# Patient Record
Sex: Male | Born: 2000 | Race: Black or African American | Hispanic: No | Marital: Single | Smoking: Current every day smoker
Health system: Southern US, Community
[De-identification: ages and names within clinical notes are randomized; demographics above are authoritative.]

## PROBLEM LIST (undated history)

## (undated) DIAGNOSIS — J45909 Unspecified asthma, uncomplicated: Secondary | ICD-10-CM

---
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
## 2023-11-20 ENCOUNTER — Emergency Department (HOSPITAL_COMMUNITY)
Admission: EM | Admit: 2023-11-20 | Discharge: 2023-11-20 | Disposition: A | Discharge status: Home / Self Care | Payer: Medicaid Other | Attending: Emergency Medicine | Admitting: Emergency Medicine

## 2023-11-20 DIAGNOSIS — K029 Dental caries, unspecified: Secondary | ICD-10-CM | POA: Diagnosis not present

## 2023-11-20 DIAGNOSIS — K0889 Other specified disorders of teeth and supporting structures: Secondary | ICD-10-CM | POA: Diagnosis present

## 2023-11-20 MED ORDER — PENICILLIN V POTASSIUM 500 MG PO TABS
500.0000 mg | ORAL_TABLET | Freq: Once | ORAL | Status: AC
  Administered 2023-11-20: 500 mg via ORAL
  Filled 2023-11-20: qty 1

## 2023-11-20 MED ORDER — LIDOCAINE VISCOUS HCL 2 % MT SOLN
15.0000 mL | Freq: Once | OROMUCOSAL | Status: AC
  Administered 2023-11-20: 15 mL via OROMUCOSAL
  Filled 2023-11-20: qty 15

## 2023-11-20 MED ORDER — PENICILLIN V POTASSIUM 500 MG PO TABS: 500.0000 mg | ORAL_TABLET | Freq: Four times a day (QID) | ORAL | 0 refills | Status: AC

## 2023-11-20 MED ORDER — MAGIC MOUTHWASH: 5.0000 mL | Freq: Three times a day (TID) | ORAL | 0 refills | Status: AC | PRN

## 2023-11-20 NOTE — ED Provider Notes (Signed)
[place] EMERGENCY DEPARTMENT AT [facility] Provider Note   CSN: [id number] Arrival date & time: 11/20/23  4098     History  Chief Complaint  Patient presents with   Dental Pain    Jeffrey Mckay is a 22 y.o. male.  The history is provided by the patient.  Dental Pain Location:  Lower Lower teeth location:  31/RL 2nd molar and 30/RL 1st molar Quality:  Aching Severity:  Severe Onset quality:  Gradual Duration: months. Timing:  Constant Progression:  Unchanged Chronicity:  Chronic Previous work-up:  Dental exam Relieved by:  Nothing Worsened by:  Nothing Ineffective treatments:  None tried Associated symptoms: no facial pain, no facial swelling and no fever   Risk factors: no diabetes        Home Medications Prior to Admission medications   Medication Sig Start Date End Date Taking? Authorizing Provider  magic mouthwash SOLN Take 5 mLs by mouth 3 (three) times daily as needed for mouth pain. Suspension contains equal amounts of Maalox Extra Strength, nystatin, and diphenhydramine. 11/20/23  Yes Sofija Antwi, MD  penicillin v potassium (VEETID) 500 MG tablet Take 1 tablet (500 mg total) by mouth 4 (four) times daily for 7 days. 11/20/23 11/27/23 Yes Gerber Penza, MD  albuterol (PROVENTIL HFA;VENTOLIN HFA) 108 (90 Base) MCG/ACT inhaler Inhale 1-2 puffs into the lungs every 6 (six) hours as needed for wheezing or shortness of breath.    [provider]  diphenhydrAMINE HCl (BENADRYL PO) Take 1 tablet by mouth daily as needed.    [provider]  ibuprofen (ADVIL,MOTRIN) 400 MG tablet Take 1 tablet (400 mg total) by mouth every 6 (six) hours as needed. Patient not taking: Reported on 06/29/2020 03/21/16   Ivery Quale, PA-C  methocarbamol (ROBAXIN) 500 MG tablet Take 1 tablet (500 mg total) by mouth 2 (two) times daily. 06/29/20   Mare Ferrari, PA-C  oxymetazoline (AFRIN NASAL SPRAY) 0.05 % nasal spray Place 1 spray into both nostrils 2  (two) times daily. Use for five days only. Patient not taking: Reported on 06/29/2020 03/21/16   Ivery Quale, PA-C  promethazine-dextromethorphan (PROMETHAZINE-DM) 6.25-15 MG/5ML syrup Take 5 mLs by mouth every 6 (six) hours as needed for cough. Patient not taking: Reported on 06/29/2020 03/21/16   Ivery Quale, PA-C      Allergies    Patient has no known allergies.    Review of Systems   Review of Systems  Constitutional:  Negative for fever.  HENT:  Positive for dental problem. Negative for facial swelling.   Eyes:  Negative for redness.  Respiratory:  Negative for wheezing and stridor.   All other systems reviewed and are negative.   Physical Exam Updated Vital Signs BP (!) 143/104 (BP Location: Left Arm)   Pulse (!) 59   Resp 19   SpO2 99%  Physical Exam Vitals and nursing note reviewed.  Constitutional:      General: He is not in acute distress.    Appearance: Normal appearance. He is well-developed. He is not diaphoretic.  HENT:     Head: Normocephalic and atraumatic.     Nose: Nose normal.     Mouth/Throat:     Comments: Wide spread dental decay 1/3 of right lower first molar is missing  Eyes:     Conjunctiva/sclera: Conjunctivae normal.     Pupils: Pupils are equal, round, and reactive to light.  Cardiovascular:     Rate and Rhythm: Normal rate and regular rhythm.  Pulmonary:  Effort: Pulmonary effort is normal.     Breath sounds: Normal breath sounds. No wheezing or rales.  Abdominal:     General: Bowel sounds are normal.     Palpations: Abdomen is soft.     Tenderness: There is no abdominal tenderness. There is no guarding or rebound.  Musculoskeletal:        General: Normal range of motion.     Cervical back: Normal range of motion and neck supple.  Skin:    General: Skin is warm and dry.     Capillary Refill: Capillary refill takes less than 2 seconds.  Neurological:     General: No focal deficit present.     Mental Status: He is alert and oriented  to person, place, and time.     Deep Tendon Reflexes: Reflexes normal.  Psychiatric:        Mood and Affect: Mood normal.        Behavior: Behavior normal.     ED Results / Procedures / Treatments   Labs (all labs ordered are listed, but only abnormal results are displayed) Labs Reviewed - No data to display  EKG None  Radiology No results found.  Procedures Procedures    Medications Ordered in ED Medications  penicillin v potassium (VEETID) tablet 500 mg (500 mg Oral Given 11/20/23 0317)  lidocaine (XYLOCAINE) 2 % viscous mouth solution 15 mL (15 mLs Mouth/Throat Given 11/20/23 1610)    ED Course/ Medical Decision Making/ A&P                                 Medical Decision Making Dental caries no recent dental care   Amount and/or Complexity of Data Reviewed External Data Reviewed: notes.    Details: Previous notes reviewed   Risk Prescription drug management. Risk Details: Wide spread dental decay will start antibiotics and refer to dentistry. Stable for discharge.     Final Clinical Impression(s) / ED Diagnoses Final diagnoses:  Pain due to dental caries   Return for intractable cough, coughing up blood, fevers > 100.4 unrelieved by medication, shortness of breath, intractable vomiting, chest pain, shortness of breath, weakness, numbness, changes in speech, facial asymmetry, abdominal pain, passing out, Inability to tolerate liquids or food, cough, altered mental status or any concerns. No signs of systemic illness or infection. The patient is nontoxic-appearing on exam and vital signs are within normal limits.  I have reviewed the triage vital signs and the nursing notes. Pertinent labs & imaging results that were available during my care of the patient were reviewed by me and considered in my medical decision making (see chart for details). After history, exam, and medical workup I feel the patient has been appropriately medically screened and is safe for  discharge home. Pertinent diagnoses were discussed with the patient. Patient was given return precautions.  Rx / DC Orders ED Discharge Orders          Ordered    penicillin v potassium (VEETID) 500 MG tablet  4 times daily        11/20/23 0329    magic mouthwash SOLN  3 times daily PRN        11/20/23 0329              Sheliah Fiorillo, MD 11/20/23 9604

## 2023-11-20 NOTE — ED Triage Notes (Signed)
PT having right sided tooth pain intermittently for months but worsening past few days. Irrigates mouth with saline and peroxide. R ear started to hurt yesterday. Pt denies fevers. States pain is unbearable. No swelling noted.

## 2023-12-12 ENCOUNTER — Emergency Department (HOSPITAL_COMMUNITY): Payer: Medicaid Other

## 2023-12-12 ENCOUNTER — Other Ambulatory Visit: Payer: Self-pay

## 2023-12-12 ENCOUNTER — Emergency Department (HOSPITAL_COMMUNITY)
Admission: EM | Admit: 2023-12-12 | Discharge: 2023-12-12 | Disposition: A | Discharge status: Home / Self Care | Payer: Medicaid Other | Attending: Emergency Medicine | Admitting: Emergency Medicine

## 2023-12-12 DIAGNOSIS — K047 Periapical abscess without sinus: Secondary | ICD-10-CM | POA: Insufficient documentation

## 2023-12-12 DIAGNOSIS — J45909 Unspecified asthma, uncomplicated: Secondary | ICD-10-CM | POA: Insufficient documentation

## 2023-12-12 DIAGNOSIS — Z7951 Long term (current) use of inhaled steroids: Secondary | ICD-10-CM | POA: Diagnosis not present

## 2023-12-12 DIAGNOSIS — K0889 Other specified disorders of teeth and supporting structures: Secondary | ICD-10-CM | POA: Diagnosis present

## 2023-12-12 LAB — I-STAT CHEM 8, ED
BUN: 9 mg/dL (ref 6–20)
Calcium, Ion: 1.18 mmol/L (ref 1.15–1.40)
Chloride: 102 mmol/L (ref 98–111)
Creatinine, Ser: 0.9 mg/dL (ref 0.61–1.24)
Glucose, Bld: 92 mg/dL (ref 70–99)
HCT: 54 % — ABNORMAL HIGH (ref 39.0–52.0)
Hemoglobin: 18.4 g/dL — ABNORMAL HIGH (ref 13.0–17.0)
Potassium: 4.5 mmol/L (ref 3.5–5.1)
Sodium: 139 mmol/L (ref 135–145)
TCO2: 27 mmol/L (ref 22–32)

## 2023-12-12 MED ORDER — SODIUM CHLORIDE 0.9 % IV SOLN
3.0000 g | Freq: Once | INTRAVENOUS | Status: AC
  Administered 2023-12-12: 3 g via INTRAVENOUS
  Filled 2023-12-12: qty 8

## 2023-12-12 MED ORDER — ONDANSETRON HCL 4 MG/2ML IJ SOLN
4.0000 mg | Freq: Once | INTRAMUSCULAR | Status: AC
  Administered 2023-12-12: 4 mg via INTRAVENOUS
  Filled 2023-12-12: qty 2

## 2023-12-12 MED ORDER — AMOXICILLIN-POT CLAVULANATE 875-125 MG PO TABS: 1.0000 | ORAL_TABLET | Freq: Two times a day (BID) | ORAL | 0 refills | Status: AC

## 2023-12-12 MED ORDER — METHYLPREDNISOLONE SODIUM SUCC 125 MG IJ SOLR
125.0000 mg | Freq: Once | INTRAMUSCULAR | Status: AC
  Administered 2023-12-12: 125 mg via INTRAVENOUS
  Filled 2023-12-12: qty 2

## 2023-12-12 MED ORDER — MORPHINE SULFATE (PF) 4 MG/ML IV SOLN
4.0000 mg | Freq: Once | INTRAVENOUS | Status: AC
  Administered 2023-12-12: 4 mg via INTRAVENOUS
  Filled 2023-12-12: qty 1

## 2023-12-12 MED ORDER — IOHEXOL 300 MG/ML  SOLN
75.0000 mL | Freq: Once | INTRAMUSCULAR | Status: AC | PRN
  Administered 2023-12-12: 75 mL via INTRAVENOUS

## 2023-12-12 MED ORDER — IOHEXOL 300 MG/ML  SOLN: 100.0000 mL | Freq: Once | INTRAMUSCULAR | Status: DC | PRN

## 2023-12-12 NOTE — ED Triage Notes (Signed)
Pt arrived via POV. C/o R sided oral swelling and pain for several days.

## 2023-12-12 NOTE — ED Notes (Signed)
Patient refused IV ABX provider aware

## 2023-12-12 NOTE — ED Provider Notes (Signed)
[place] EMERGENCY DEPARTMENT AT [facility] Provider Note   CSN: [id number] Arrival date & time: 12/12/23  1358     History  Chief Complaint  Patient presents with   Oral Swelling   Dental Pain    Jeffrey Mckay is a 22 y.o. male with PMHx asthma who presents to ED concerned with right lower dental pain and swelling. Patient stating that it started 2 days ago. Endorses broken tooth in the right lower side x3 months. Denies throat swelling or difficulty tolerating PO intake.  Denies fever, chest pain, dyspnea, cough, nausea, vomiting, diarrhea.   Dental Pain      Home Medications Prior to Admission medications   Medication Sig Start Date End Date Taking? Authorizing Provider  acetaminophen (TYLENOL) 500 MG tablet Take 2,500 mg by mouth as needed for moderate pain (pain score 4-6).   Yes [provider]  albuterol (PROVENTIL HFA;VENTOLIN HFA) 108 (90 Base) MCG/ACT inhaler Inhale 1-2 puffs into the lungs every 6 (six) hours as needed for wheezing or shortness of breath.   Yes [provider]  amoxicillin-clavulanate (AUGMENTIN) 875-125 MG tablet Take 1 tablet by mouth every 12 (twelve) hours for 10 days. 12/12/23 12/22/23 Yes Janay Canan, Charlotte Sanes F, PA-C  magic mouthwash SOLN Take 5 mLs by mouth 3 (three) times daily as needed for mouth pain. Suspension contains equal amounts of Maalox Extra Strength, nystatin, and diphenhydramine. Patient not taking: Reported on 12/12/2023 11/20/23   Palumbo, April, MD  nystatin (MYCOSTATIN) 100000 UNIT/ML suspension Take by mouth. Patient not taking: Reported on 12/12/2023 11/22/23   [provider]      Allergies    Patient has no known allergies.    Review of Systems   Review of Systems  HENT:  Positive for dental problem.     Physical Exam Updated Vital Signs BP (!) 144/101   Pulse 74   Temp 99.1 F (37.3 C) (Oral)   Resp 18   Ht 6' (1.829 m)   Wt 68 kg   SpO2 100%   BMI 20.34 kg/m   Physical Exam Vitals and nursing note reviewed.  Constitutional:      General: He is not in acute distress.    Appearance: He is not ill-appearing or toxic-appearing.  HENT:     Head: Normocephalic and atraumatic.     Mouth/Throat:     Mouth: Mucous membranes are moist.     Pharynx: No oropharyngeal exudate or posterior oropharyngeal erythema.      Comments: 3cm area of swelling along the exterior right mandibular surface. No obvious oropharyngeal swelling or lesions. Broken molar on right lower side. No trismus. Eyes:     General: No scleral icterus.       Right eye: No discharge.        Left eye: No discharge.     Conjunctiva/sclera: Conjunctivae normal.  Cardiovascular:     Rate and Rhythm: Normal rate.  Pulmonary:     Effort: Pulmonary effort is normal.  Abdominal:     General: Abdomen is flat.  Skin:    General: Skin is warm and dry.     Findings: No rash.  Neurological:     General: No focal deficit present.     Mental Status: He is alert. Mental status is at baseline.  Psychiatric:        Mood and Affect: Mood normal.        Behavior: Behavior normal.     ED Results / Procedures / Treatments  Labs (all labs ordered are listed, but only abnormal results are displayed) Labs Reviewed  I-STAT CHEM 8, ED - Abnormal; Notable for the following components:      Result Value   Hemoglobin 18.4 (*)    HCT 54.0 (*)    All other components within normal limits    EKG None  Radiology CT Soft Tissue Neck W Contrast Result Date: 12/12/2023 CLINICAL DATA:  Provided history: Soft tissue infection suspected, neck x-ray done. Additional history provided: Right sided oral swelling and pain for several days. EXAM: CT NECK WITH CONTRAST TECHNIQUE: Multidetector CT imaging of the neck was performed using the standard protocol following the bolus administration of intravenous contrast. RADIATION DOSE REDUCTION: This exam was performed according to the departmental  dose-optimization program which includes automated exposure control, adjustment of the mA and/or kV according to patient size and/or use of iterative reconstruction technique. CONTRAST:  75mL OMNIPAQUE IOHEXOL 300 MG/ML  SOLN COMPARISON:  Head CT 03/18/2020 FINDINGS: Pharynx and larynx: No appreciable swelling or mass within the oral cavity, pharynx or larynx. Salivary glands: No inflammation, mass, or stone. Thyroid: 3 mm right thyroid lobe nodule not meeting consensus criteria for ultrasound follow-up based on size. No follow-up imaging recommended. Reference: J Am Coll Radiol. 2015 Feb;12(2): 143-50. Lymph nodes: Prominent right perimandibular lymph node measuring 10 mm in short-axis, likely reactive (series 3, image 64). Vascular: The major vascular structures of the neck are patent. Limited intracranial: No evidence of an acute intracranial abnormality within the field of view. Visualized orbits: No orbital mass or acute orbital finding. Mastoids and visualized paranasal sinuses: A small portion of the right frontal sinus is excluded from the field of view superiorly. No significant paranasal sinus disease at the imaged levels. No significant mastoid effusion. Skeleton: Nonspecific reversal of the expected cervical lordosis. Upper chest: No consolidation within the imaged lung apices. Other: Periapical lucency surrounding the right mandibular first molar tooth (consistent with apical periodontal disease and possible periapical abscess). This tooth is carious. There is an adjacent low-density and peripherally enhancing focus along the superficial aspect of the right mandibular body, measuring 12 x 5 mm in transaxial dimensions, suspicious for a soft tissue abscess (series 3, image 61). Surrounding soft tissue swelling and edema centered in the right perimandibular region consistent with cellulitis. IMPRESSION: 1. Periapical lucency surrounding the carious right mandibular first molar tooth (consistent with  apical periodontal disease and possible periapical abscess). Adjacent 12 x 5 mm hypodense and peripherally enhancing focus along the superficial aspect of the right mandibular body, suspicious for a soft tissue abscess. Surrounding facial/perimandibular cellulitis. 2. Prominent right perimandibular lymph node, likely reactive. Electronically Signed   By: Jackey Loge D.O.   On: 12/12/2023 19:10    Procedures Procedures    Medications Ordered in ED Medications  methylPREDNISolone sodium succinate (SOLU-MEDROL) 125 mg/2 mL injection 125 mg (has no administration in time range)  Ampicillin-Sulbactam (UNASYN) 3 g in sodium chloride 0.9 % 100 mL IVPB (0 g Intravenous Stopped 12/12/23 1716)  morphine (PF) 4 MG/ML injection 4 mg (4 mg Intravenous Given 12/12/23 1647)  ondansetron (ZOFRAN) injection 4 mg (4 mg Intravenous Given 12/12/23 1648)  iohexol (OMNIPAQUE) 300 MG/ML solution 75 mL (75 mLs Intravenous Contrast Given 12/12/23 1817)    ED Course/ Medical Decision Making/ A&P                                 Medical Decision  Making Amount and/or Complexity of Data Reviewed Radiology: ordered.  Risk Prescription drug management.    This patient presents to the ED for concern of dental pain/abscess, this involves an extensive number of treatment options, and is a complaint that carries with it a high risk of complications and morbidity.  The differential diagnosis includes deep space infection   Co morbidities that complicate the patient evaluation  none   Additional history obtained:  PCP with [facility]   Imaging Studies ordered:  I ordered imaging studies including  -CT soft tissue: to assess for abscess/deep space infection  I independently visualized and interpreted imaging I agree with the radiologist interpretation    Problem List / ED Course / Critical interventions / Medication management  Patient presents to ED concerned for dental abscess.   Physical exam showing multiple carries and broken right sided molar with swelling of right mandibular area. Patient denies recently seeing a Dentist. Patient afebrile with stable vitals. Denying any other infectious symptoms today. CTA showing a very small abscess (12 x 5 mm) with extensive surrounding swelling concerning for cellulitis. This area of swelling is not impinging on patient's airway. Provided patient with a dose of IV Unasyn in ED and with discharge with a 10 day course of Augmentin. Provided patient with list for dentist in the area. Educated patient that symptoms will return and may become more complicated if dentist follow up is not obtained. Patient verbally endorsed understanding of plan. Staffed with Dr. Wilkie Aye. I have reviewed the patients home medicines and have made adjustments as needed Patient afebrile with stable vials. Provided with return precautions. Discharged in good condition.  Ddx: these are considered less likely due to history of present illness and physical exam findings -Deep space infection of head/neck: Denies dyspnea, dysphagia, trismus -Sepsis: patient afebrile without tachycardia or hypotension; no leukocytosis   Social Determinants of Health:  none         Final Clinical Impression(s) / ED Diagnoses Final diagnoses:  Dental infection    Rx / DC Orders ED Discharge Orders          Ordered    amoxicillin-clavulanate (AUGMENTIN) 875-125 MG tablet  Every 12 hours        12/12/23 1955              Dorthy Cooler, [place] 12/12/23 2006    Rozelle Logan, [place] 12/13/23 [phone]

## 2023-12-12 NOTE — Discharge Instructions (Addendum)
It was a pleasure caring for you today. Please contact a dentist tomorrow for a quick follow up appointment. Also follow up with your primary care provider. Seek emergency care if experiencing any new or worsening symptoms.  I have had great luck with quick appointments with [facility] here in [place].  Alternating between 650 mg Tylenol and 400 mg Advil: The best way to alternate taking Acetaminophen (example Tylenol) and Ibuprofen (example Advil/Motrin) is to take them 3 hours apart. For example, if you take ibuprofen at 6 am you can then take Tylenol at 9 am. You can continue this regimen throughout the day, making sure you do not exceed the recommended maximum dose for each drug.

## 2024-02-18 ENCOUNTER — Emergency Department (HOSPITAL_COMMUNITY)
Admission: EM | Admit: 2024-02-18 | Discharge: 2024-02-18 | Disposition: A | Discharge status: Home / Self Care | Attending: Emergency Medicine | Admitting: Emergency Medicine

## 2024-02-18 ENCOUNTER — Emergency Department (HOSPITAL_COMMUNITY)

## 2024-02-18 ENCOUNTER — Other Ambulatory Visit: Payer: Self-pay

## 2024-02-18 DIAGNOSIS — R0789 Other chest pain: Secondary | ICD-10-CM | POA: Diagnosis not present

## 2024-02-18 DIAGNOSIS — R112 Nausea with vomiting, unspecified: Secondary | ICD-10-CM | POA: Diagnosis present

## 2024-02-18 DIAGNOSIS — R059 Cough, unspecified: Secondary | ICD-10-CM | POA: Insufficient documentation

## 2024-02-18 LAB — CBC
HCT: 42.3 % (ref 39.0–52.0)
Hemoglobin: 13.6 g/dL (ref 13.0–17.0)
MCH: 30.2 pg (ref 26.0–34.0)
MCHC: 32.2 g/dL (ref 30.0–36.0)
MCV: 94 fL (ref 80.0–100.0)
Platelets: 130 10*3/uL — ABNORMAL LOW (ref 150–400)
RBC: 4.5 MIL/uL (ref 4.22–5.81)
RDW: 14.7 % (ref 11.5–15.5)
WBC: 10.3 10*3/uL (ref 4.0–10.5)
nRBC: 0 % (ref 0.0–0.2)

## 2024-02-18 LAB — LIPASE, BLOOD: Lipase: 34 U/L (ref 11–51)

## 2024-02-18 LAB — RESP PANEL BY RT-PCR (RSV, FLU A&B, COVID)  RVPGX2
Influenza A by PCR: NEGATIVE
Influenza B by PCR: NEGATIVE
Resp Syncytial Virus by PCR: NEGATIVE
SARS Coronavirus 2 by RT PCR: NEGATIVE

## 2024-02-18 LAB — COMPREHENSIVE METABOLIC PANEL
ALT: 16 U/L (ref 0–44)
AST: 23 U/L (ref 15–41)
Albumin: 4.3 g/dL (ref 3.5–5.0)
Alkaline Phosphatase: 68 U/L (ref 38–126)
Anion gap: 6 (ref 5–15)
BUN: 18 mg/dL (ref 6–20)
CO2: 27 mmol/L (ref 22–32)
Calcium: 8.5 mg/dL — ABNORMAL LOW (ref 8.9–10.3)
Chloride: 105 mmol/L (ref 98–111)
Creatinine, Ser: 0.98 mg/dL (ref 0.61–1.24)
GFR, Estimated: >60 mL/min (ref >60)
Glucose, Bld: 89 mg/dL (ref 70–99)
Potassium: 3.5 mmol/L (ref 3.5–5.1)
Sodium: 138 mmol/L (ref 135–145)
Total Bilirubin: 0.3 mg/dL (ref 0.0–1.2)
Total Protein: 6.8 g/dL (ref 6.5–8.1)

## 2024-02-18 MED ORDER — ONDANSETRON 4 MG PO TBDP: 4.0000 mg | ORAL_TABLET | Freq: Three times a day (TID) | ORAL | 0 refills | Status: AC | PRN

## 2024-02-18 MED ORDER — ONDANSETRON HCL 4 MG/2ML IJ SOLN
4.0000 mg | Freq: Once | INTRAMUSCULAR | Status: AC
  Administered 2024-02-18: 4 mg via INTRAVENOUS
  Filled 2024-02-18: qty 2

## 2024-02-18 MED ORDER — LACTATED RINGERS IV BOLUS
1000.0000 mL | Freq: Once | INTRAVENOUS | Status: AC
  Administered 2024-02-18: 1000 mL via INTRAVENOUS

## 2024-02-18 NOTE — Discharge Instructions (Addendum)
 Your workup today was reassuring.  Have sent nausea medication into the pharmacy for you.  Drink plenty of fluids.  Return if you have any emergent symptoms.  Otherwise follow-up with your primary care provider.

## 2024-02-18 NOTE — ED Triage Notes (Signed)
 Pt arrived via POV. C/o N/V since last night and a cough for 4x days.  No known sick contacts.

## 2024-02-18 NOTE — ED Provider Notes (Signed)
 [place] EMERGENCY DEPARTMENT AT [facility] Provider Note   CSN: [id number] Arrival date & time: 02/18/24  7829     History  Chief Complaint  Patient presents with   Nausea   Emesis   Cough    Jeffrey Mckay is a 23 y.o. male.  23 year old male presents today for concern of URI symptoms for the past 4 days however he developed nausea and vomiting starting last night and he states he has had multiple episodes and has not been able to keep any food or drink down.  Denies hematemesis.  Denies abdominal pain.  The history is provided by the patient. No language interpreter was used.       Home Medications Prior to Admission medications   Medication Sig Start Date End Date Taking? Authorizing Provider  acetaminophen (TYLENOL) 500 MG tablet Take 2,500 mg by mouth as needed for moderate pain (pain score 4-6).    [provider]  albuterol (PROVENTIL HFA;VENTOLIN HFA) 108 (90 Base) MCG/ACT inhaler Inhale 1-2 puffs into the lungs every 6 (six) hours as needed for wheezing or shortness of breath.    [provider]  magic mouthwash SOLN Take 5 mLs by mouth 3 (three) times daily as needed for mouth pain. Suspension contains equal amounts of Maalox Extra Strength, nystatin, and diphenhydramine. Patient not taking: Reported on 12/12/2023 11/20/23   Palumbo, April, MD  nystatin (MYCOSTATIN) 100000 UNIT/ML suspension Take by mouth. Patient not taking: Reported on 12/12/2023 11/22/23   [provider]      Allergies    Patient has no known allergies.    Review of Systems   Review of Systems  Constitutional:  Negative for chills and fever.  Respiratory:  Positive for cough. Negative for shortness of breath.   Cardiovascular:  Negative for chest pain (Does endorse some chest tightness but no chest pain).  Gastrointestinal:  Positive for nausea and vomiting. Negative for abdominal pain.  Neurological:  Negative for light-headedness.  All other  systems reviewed and are negative.   Physical Exam Updated Vital Signs BP 115/78 (BP Location: Left Arm)   Pulse 82   Temp 98.2 F (36.8 C) (Oral)   Resp 17   Ht 6' (1.829 m)   Wt 72.6 kg   SpO2 100%   BMI 21.70 kg/m  Physical Exam Vitals and nursing note reviewed.  Constitutional:      General: He is not in acute distress.    Appearance: Normal appearance. He is not ill-appearing.  HENT:     Head: Normocephalic and atraumatic.     Nose: Nose normal.  Eyes:     General: No scleral icterus.    Extraocular Movements: Extraocular movements intact.     Conjunctiva/sclera: Conjunctivae normal.  Cardiovascular:     Rate and Rhythm: Normal rate and regular rhythm.  Pulmonary:     Effort: Pulmonary effort is normal. No respiratory distress.     Breath sounds: Normal breath sounds. No wheezing or rales.  Abdominal:     General: There is no distension.     Palpations: Abdomen is soft.     Tenderness: There is no abdominal tenderness. There is no guarding.  Musculoskeletal:        General: Normal range of motion.     Cervical back: Normal range of motion.  Skin:    General: Skin is warm and dry.  Neurological:     General: No focal deficit present.     Mental Status: He is alert.  Mental status is at baseline.     ED Results / Procedures / Treatments   Labs (all labs ordered are listed, but only abnormal results are displayed) Labs Reviewed  RESP PANEL BY RT-PCR (RSV, FLU A&B, COVID)  [id number]  LIPASE, BLOOD  COMPREHENSIVE METABOLIC PANEL  CBC    EKG None  Radiology No results found.  Procedures Procedures    Medications Ordered in ED Medications  lactated ringers bolus 1,000 mL (has no administration in time range)  ondansetron (ZOFRAN) injection 4 mg (has no administration in time range)    ED Course/ Medical Decision Making/ A&P                                 Medical Decision Making Amount and/or Complexity of Data Reviewed Labs:  ordered. Radiology: ordered.  Risk Prescription drug management.   23 year old male presents today for concern of nausea, vomiting, and cough.  He does endorse some chest tightness but no chest pain.  Denies shortness of breath.  Hemodynamically stable.  Will provide Zofran, IV fluids and obtain blood work.  Abdomen soft nontender. Differential diagnosis could include but not limited to gastroenteritis, colitis, pancreatitis, cholecystitis, appendicitis. Admission considered but will reevaluate after labs and imaging.  CBC without leukocytosis or anemia.  Respiratory panel negative.  Lipase within normal.  CMP without acute concern. Chest x-ray without acute cardiopulmonary process. Likely gastroenteritis.  Discharged in stable condition.  Return precaution discussed.  Patient voices understanding and is in agreement with plan.   Final Clinical Impression(s) / ED Diagnoses Final diagnoses:  Nausea and vomiting, unspecified vomiting type    Rx / DC Orders ED Discharge Orders          Ordered    ondansetron (ZOFRAN-ODT) 4 MG disintegrating tablet  Every 8 hours PRN        02/18/24 1001              Marita [place], PA-C 02/18/24 1003    Alvira Monday, MD 02/20/24 1207

## 2024-03-18 ENCOUNTER — Ambulatory Visit: Attending: Obstetrics and Gynecology

## 2024-03-18 DIAGNOSIS — Z31440 Encounter of male for testing for genetic disease carrier status for procreative management: Secondary | ICD-10-CM

## 2024-04-01 ENCOUNTER — Telehealth: Payer: Self-pay

## 2024-04-01 LAB — HORIZON CUSTOM: REPORT SUMMARY: NEGATIVE

## 2024-04-01 NOTE — Telephone Encounter (Signed)
 Unable to leave VM to return his carrier screening results that were negative for alpha thalassemia.  Georgean Kindle, MS, [facility] Certified Genetic Counselor [facility] [phone]

## 2024-10-02 ENCOUNTER — Encounter (HOSPITAL_COMMUNITY): Payer: Self-pay

## 2024-10-02 ENCOUNTER — Emergency Department (HOSPITAL_COMMUNITY): Admission: EM | Admit: 2024-10-02 | Discharge: 2024-10-02 | Disposition: A | Discharge status: Home / Self Care

## 2024-10-02 ENCOUNTER — Other Ambulatory Visit: Payer: Self-pay

## 2024-10-02 DIAGNOSIS — K029 Dental caries, unspecified: Secondary | ICD-10-CM | POA: Insufficient documentation

## 2024-10-02 DIAGNOSIS — K0889 Other specified disorders of teeth and supporting structures: Secondary | ICD-10-CM | POA: Diagnosis present

## 2024-10-02 MED ORDER — ALUMINUM HYDROXIDE GEL 320 MG/5ML PO SUSP: 30.0000 mL | Freq: Four times a day (QID) | ORAL | 0 refills | Status: AC | PRN

## 2024-10-02 MED ORDER — AMOXICILLIN-POT CLAVULANATE 875-125 MG PO TABS
1.0000 | ORAL_TABLET | Freq: Once | ORAL | Status: AC
  Administered 2024-10-02: 1 via ORAL
  Filled 2024-10-02: qty 1

## 2024-10-02 MED ORDER — ACETAMINOPHEN 325 MG PO TABS
650.0000 mg | ORAL_TABLET | Freq: Once | ORAL | Status: AC
  Administered 2024-10-02: 650 mg via ORAL
  Filled 2024-10-02: qty 2

## 2024-10-02 MED ORDER — NAPROXEN 500 MG PO TABS: 500.0000 mg | ORAL_TABLET | Freq: Two times a day (BID) | ORAL | 0 refills | Status: AC

## 2024-10-02 MED ORDER — KETOROLAC TROMETHAMINE 15 MG/ML IJ SOLN
15.0000 mg | Freq: Once | INTRAMUSCULAR | Status: AC
  Administered 2024-10-02: 15 mg via INTRAMUSCULAR
  Filled 2024-10-02: qty 1

## 2024-10-02 MED ORDER — MAGIC MOUTHWASH
10.0000 mL | Freq: Once | ORAL | Status: AC
  Administered 2024-10-02: 10 mL via ORAL
  Filled 2024-10-02: qty 10

## 2024-10-02 MED ORDER — AMOXICILLIN-POT CLAVULANATE 875-125 MG PO TABS
1.0000 | ORAL_TABLET | Freq: Two times a day (BID) | ORAL | 0 refills | Status: AC
Start: 2024-10-02 — End: ?

## 2024-10-02 NOTE — ED Triage Notes (Signed)
 PT. Stated he is having dental pain on the bottom left side of his mouth. There appears to be significant dental decay on one molar.

## 2024-10-02 NOTE — Discharge Instructions (Addendum)
 You are seen today for dental pain and dental caries.  Your teeth are severely decayed, requiring dentist.  You will need to follow-up with dentistry.  I have prescribed some antibiotics which will help with inflammation as well as Magic mouthwash which will also help with pain.  Additionally I am sending in naproxen to help with pain.  You can take Tylenol  with this medication to help for further relief.  Take Tylenol  (acetominophen)  650mg  every 4-6 hours, as needed for pain or fever. Do not take more than 4,000 mg in a 24-hour period. As this may cause liver damage. While this is rare, if you begin to develop yellowing of the skin or eyes, stop taking and return to ER immediately.  Please take Naprosyn, 500mg  by mouth twice daily as needed for pain - this in an antiinflammatory medicine (NSAID) and is similar to ibuprofen  - many people feel that it is stronger than ibuprofen  and it is easier to take since it is a smaller pill.  Please use this only for 1 week - if your pain persists, you will need to follow up with your doctor in the office for ongoing guidance and pain control.    If you begin to experience trouble swallowing, voice change, fever, uncontrollable pain, swelling underneath your chin or tongue, or difficulty breathing, please return to ED immediately for further evaluation.  Look at the resources that we provided for you for dentistry as you will need to follow-up with them long-term.  [facility] offers a free clinic that you can check out.

## 2024-10-02 NOTE — ED Provider Notes (Signed)
 [place] EMERGENCY DEPARTMENT AT [facility] Provider Note   CSN: [id number] Arrival date & time: 10/02/24  [id number]     Patient presents with: Dental Pain   Jeffrey Mckay is a 23 y.o. male.  Dental Pain Patient is a 23 year old male presenting today for left-sided dental pain has been present for the last 2 weeks.  Notably has had some left-sided jaw pain.  Reports that he had similar symptoms 1 year ago and has not followed up with dentist since.  At the time had a small abscess.  Denies fever, headache, vision changes, dysphagia, odynophagia, voice change, submental/sublingual pain.     Prior to Admission medications   Medication Sig Start Date End Date Taking? Authorizing Provider  aluminum hydroxide (AMPHOJEL/ALTERNAGEL) 320 MG/5ML suspension Take 30 mLs by mouth every 6 (six) hours as needed for indigestion or heartburn. 10/02/24  Yes Beola Terrall RAMAN, PA-C  amoxicillin -clavulanate (AUGMENTIN ) 875-125 MG tablet Take 1 tablet by mouth every 12 (twelve) hours. 10/02/24  Yes Beola Terrall RAMAN, PA-C  naproxen (NAPROSYN) 500 MG tablet Take 1 tablet (500 mg total) by mouth 2 (two) times daily. 10/02/24  Yes Ziva Nunziata S, PA-C  acetaminophen  (TYLENOL ) 500 MG tablet Take 2,500 mg by mouth as needed for moderate pain (pain score 4-6).    [provider]  albuterol (PROVENTIL HFA;VENTOLIN HFA) 108 (90 Base) MCG/ACT inhaler Inhale 1-2 puffs into the lungs every 6 (six) hours as needed for wheezing or shortness of breath.    [provider]  magic mouthwash SOLN Take 5 mLs by mouth 3 (three) times daily as needed for mouth pain. Suspension contains equal amounts of Maalox Extra Strength, nystatin, and diphenhydramine. Patient not taking: Reported on 12/12/2023 11/20/23   Palumbo, April, MD  nystatin (MYCOSTATIN) 100000 UNIT/ML suspension Take by mouth. Patient not taking: Reported on 12/12/2023 11/22/23   [provider]  ondansetron  (ZOFRAN -ODT) 4 MG  disintegrating tablet Take 1 tablet (4 mg total) by mouth every 8 (eight) hours as needed. 02/18/24   Hildegard Loge, PA-C    Allergies: Patient has no known allergies.    Review of Systems  HENT:  Positive for dental problem.   All other systems reviewed and are negative.   Updated Vital Signs BP 130/88 (BP Location: Left Arm)   Pulse 80   Temp 97.7 F (36.5 C) (Oral)   Resp 18   Wt 68 kg   SpO2 99%   BMI 20.34 kg/m   Physical Exam Vitals and nursing note reviewed.  Constitutional:      General: He is not in acute distress.    Appearance: Normal appearance. He is not ill-appearing or diaphoretic.  HENT:     Head: Normocephalic and atraumatic.     Mouth/Throat:     Mouth: Mucous membranes are moist.     Dentition: Abnormal dentition. Dental tenderness and dental caries present. No gingival swelling, dental abscesses or gum lesions.     Tongue: No lesions.     Pharynx: Oropharynx is clear. Uvula midline. No pharyngeal swelling, oropharyngeal exudate, posterior oropharyngeal erythema or uvula swelling.     Tonsils: No tonsillar exudate or tonsillar abscesses.      Comments: No submental tenderness noted.  No abscess noted in the oropharynx.  Obvious dental caries noted throughout mouth with tenderness noted over 17, 18, 19.  No TMJ tenderness. Eyes:     General: No scleral icterus.       Right eye: No discharge.  Left eye: No discharge.     Extraocular Movements: Extraocular movements intact.     Conjunctiva/sclera: Conjunctivae normal.  Cardiovascular:     Rate and Rhythm: Normal rate and regular rhythm.     Pulses: Normal pulses.     Heart sounds: Normal heart sounds. No murmur heard.    No friction rub. No gallop.  Pulmonary:     Effort: Pulmonary effort is normal. No respiratory distress.     Breath sounds: No stridor. No wheezing, rhonchi or rales.  Chest:     Chest wall: No tenderness.  Abdominal:     General: Abdomen is flat. There is no distension.      Palpations: Abdomen is soft.     Tenderness: There is no abdominal tenderness. There is no right CVA tenderness, left CVA tenderness, guarding or rebound.  Musculoskeletal:        General: No swelling, deformity or signs of injury.     Cervical back: Normal range of motion. No rigidity.     Right lower leg: No edema.     Left lower leg: No edema.  Skin:    General: Skin is warm and dry.     Findings: No bruising, erythema or lesion.  Neurological:     General: No focal deficit present.     Mental Status: He is alert and oriented to person, place, and time. Mental status is at baseline.     Sensory: No sensory deficit.     Motor: No weakness.  Psychiatric:        Mood and Affect: Mood normal.     (all labs ordered are listed, but only abnormal results are displayed) Labs Reviewed - No data to display  EKG: None  Radiology: No results found.   Procedures   Medications Ordered in the ED  ketorolac (TORADOL) 15 MG/ML injection 15 mg (15 mg Intramuscular Given 10/02/24 0853)  amoxicillin -clavulanate (AUGMENTIN ) 875-125 MG per tablet 1 tablet (1 tablet Oral Given 10/02/24 0853)  acetaminophen  (TYLENOL ) tablet 650 mg (650 mg Oral Given 10/02/24 0853)  magic mouthwash (10 mLs Oral Given 10/02/24 0857)    Medical Decision Making Risk OTC drugs. Prescription drug management.   This patient is a 23 year old male who presents to the ED for concern of left-sided dental pain noted to be present x 2 weeks.  Reported that he has had pain radiating to left side of jaw.  No dysphagia, odynophagia, headache, vision changes, submental/sublingual pain.  On physical exam, patient is in no acute distress, afebrile, alert and orient x 4, speaking in full sentences, nontachypneic, nontachycardic.  No abscess noted in the oropharynx.  Did have tenderness as well as severe  dental caries noted to be 17, 18, 19.  No TMJ tenderness.  No submental tenderness, no sublingual tenderness.  No signs of  PTA, retropharyngeal abscess.  Low suspicion for PTA, retropharyngeal abscess, Ludwig's angina.  Will have him continue follow-up with dentistry, providing Augmentin  as well as Toradol and Magic mouthwash today.  Provided dental resources for patient.  Patient vital signs have remained stable throughout the course of patient's time in the ED. Low suspicion for any other emergent pathology at this time. I believe this patient is safe to be discharged. Provided strict return to ER precautions. Patient expressed agreement and understanding of plan. All questions were answered.  Differential diagnoses prior to evaluation: The emergent differential diagnosis includes, but is not limited to,  dental caries, pulpitis, gingivitis, dental fracture, dental abscess, Ludwig syndrome, PTA,  retropharyngeal abscess, alveolar osteitis. This is not an exhaustive differential.   Past Medical History / Co-morbidities / Social History: Asthma  Additional history: Chart reviewed. Pertinent results include:   Last seen in the emergency department 12/07/2019 comfortable, reporting a broken tooth right lower side at that time for 3 months.  Provided IV Unasyn  as well as sent home on Augmentin  for small abscess measuring 12 x 5 mm.   Medications: I ordered medication including Augmentin , mouthwash, Toradol, naproxen.  I have reviewed the patients home medicines and have made adjustments as needed.  Critical Interventions: None  Social Determinants of Health: Notably does not have a dentist and has not seen dentistry.  Disposition: After consideration of the diagnostic results and the patients response to treatment, I feel that the patient would benefit from discharge home as above.   emergency department workup does not suggest an emergent condition requiring admission or immediate intervention beyond what has been performed at this time. The plan is: Follow-up with dentistry, Augmentin , symptomatic monitoring  at home and return to the ER for any new or worsening symptoms. The patient is safe for discharge and has been instructed to return immediately for worsening symptoms, change in symptoms or any other concerns.   Final diagnoses:  Pain, dental  Dental caries    ED Discharge Orders          Ordered    amoxicillin -clavulanate (AUGMENTIN ) 875-125 MG tablet  Every 12 hours        10/02/24 0920    aluminum hydroxide (AMPHOJEL/ALTERNAGEL) 320 MG/5ML suspension  Every 6 hours PRN        10/02/24 0920    naproxen (NAPROSYN) 500 MG tablet  2 times daily        10/02/24 0920               Beola Terrall RAMAN, PA-C 10/02/24 0920    Kammerer, Megan L, DO 10/05/24 [phone]
